# Patient Record
Sex: Female | Born: 1945 | Race: White | Hispanic: No | Marital: Married | Smoking: Never smoker
Health system: Southern US, Community
[De-identification: ages and names within clinical notes are randomized; demographics above are authoritative.]

## PROBLEM LIST (undated history)

## (undated) DIAGNOSIS — E785 Hyperlipidemia, unspecified: Secondary | ICD-10-CM

---

## 2012-08-09 ENCOUNTER — Ambulatory Visit: Payer: Self-pay | Admitting: Family Medicine

## 2012-11-30 ENCOUNTER — Ambulatory Visit: Payer: Self-pay | Admitting: Emergency Medicine

## 2012-12-03 LAB — BETA STREP CULTURE(ARMC)

## 2013-03-11 ENCOUNTER — Ambulatory Visit: Payer: Self-pay

## 2013-03-11 LAB — URINALYSIS, COMPLETE
Bilirubin,UR: NEGATIVE
Glucose,UR: NEGATIVE mg/dL (ref 0–75)
Ketone: NEGATIVE
Nitrite: NEGATIVE
Protein: NEGATIVE
Specific Gravity: 1.01 (ref 1.003–1.030)

## 2013-03-13 LAB — URINE CULTURE

## 2013-08-12 ENCOUNTER — Ambulatory Visit: Payer: Self-pay | Admitting: Family Medicine

## 2014-09-02 ENCOUNTER — Other Ambulatory Visit: Payer: Self-pay | Admitting: Family Medicine

## 2014-09-02 ENCOUNTER — Ambulatory Visit
Admission: RE | Admit: 2014-09-02 | Discharge: 2014-09-02 | Disposition: A | Discharge status: Home / Self Care | Payer: Medicare PPO | Source: Ambulatory Visit | Attending: Family Medicine | Admitting: Family Medicine

## 2014-09-02 DIAGNOSIS — Z1239 Encounter for other screening for malignant neoplasm of breast: Secondary | ICD-10-CM

## 2014-09-04 ENCOUNTER — Other Ambulatory Visit: Payer: Self-pay | Admitting: Family Medicine

## 2014-09-04 DIAGNOSIS — R928 Other abnormal and inconclusive findings on diagnostic imaging of breast: Secondary | ICD-10-CM

## 2014-09-04 DIAGNOSIS — N63 Unspecified lump in unspecified breast: Secondary | ICD-10-CM

## 2014-09-11 ENCOUNTER — Ambulatory Visit
Admission: RE | Admit: 2014-09-11 | Discharge: 2014-09-11 | Disposition: A | Discharge status: Home / Self Care | Payer: Medicare PPO | Source: Ambulatory Visit | Attending: Family Medicine | Admitting: Family Medicine

## 2014-09-11 DIAGNOSIS — N63 Unspecified lump in unspecified breast: Secondary | ICD-10-CM

## 2014-09-11 DIAGNOSIS — R928 Other abnormal and inconclusive findings on diagnostic imaging of breast: Secondary | ICD-10-CM

## 2014-09-21 ENCOUNTER — Other Ambulatory Visit: Payer: Self-pay | Admitting: Family Medicine

## 2014-09-21 DIAGNOSIS — R928 Other abnormal and inconclusive findings on diagnostic imaging of breast: Secondary | ICD-10-CM

## 2014-09-21 DIAGNOSIS — N63 Unspecified lump in unspecified breast: Secondary | ICD-10-CM

## 2014-09-29 ENCOUNTER — Other Ambulatory Visit: Payer: Self-pay | Admitting: Family Medicine

## 2014-09-29 ENCOUNTER — Ambulatory Visit
Admission: RE | Admit: 2014-09-29 | Discharge: 2014-09-29 | Disposition: A | Discharge status: Home / Self Care | Payer: Medicare PPO | Source: Ambulatory Visit | Attending: Family Medicine | Admitting: Family Medicine

## 2014-09-29 DIAGNOSIS — N63 Unspecified lump in unspecified breast: Secondary | ICD-10-CM

## 2014-09-29 DIAGNOSIS — R928 Other abnormal and inconclusive findings on diagnostic imaging of breast: Secondary | ICD-10-CM

## 2014-09-29 DIAGNOSIS — N6002 Solitary cyst of left breast: Secondary | ICD-10-CM | POA: Diagnosis not present

## 2015-08-23 ENCOUNTER — Other Ambulatory Visit: Payer: Self-pay | Admitting: Family Medicine

## 2015-08-23 DIAGNOSIS — Z1231 Encounter for screening mammogram for malignant neoplasm of breast: Secondary | ICD-10-CM

## 2015-09-06 ENCOUNTER — Ambulatory Visit: Admission: RE | Admit: 2015-09-06 | Payer: Medicare PPO | Source: Ambulatory Visit

## 2015-09-20 ENCOUNTER — Ambulatory Visit: Payer: Medicare PPO

## 2015-09-27 ENCOUNTER — Ambulatory Visit: Payer: Medicare PPO

## 2015-10-11 ENCOUNTER — Ambulatory Visit
Admission: RE | Admit: 2015-10-11 | Discharge: 2015-10-11 | Disposition: A | Discharge status: Home / Self Care | Payer: Medicare HMO | Source: Ambulatory Visit | Attending: Family Medicine | Admitting: Family Medicine

## 2015-10-11 DIAGNOSIS — Z1231 Encounter for screening mammogram for malignant neoplasm of breast: Secondary | ICD-10-CM | POA: Diagnosis present

## 2015-10-17 IMAGING — MG MM DIGITAL DIAGNOSTIC UNILAT*L*
2 series · 2 of 2 positions shown · non-contrast
Comparison: Prior exams

CLINICAL DATA: Patient presented for ultrasound-guided core needle
biopsy of an area of abnormal echogenicity in the left breast
corresponding to a small focal opacity in the lateral left breast
mammographically.

EXAM:
DIGITAL DIAGNOSTIC LEFT MAMMOGRAM WITH CAD
ULTRASOUND LEFT BREAST

[L CC]
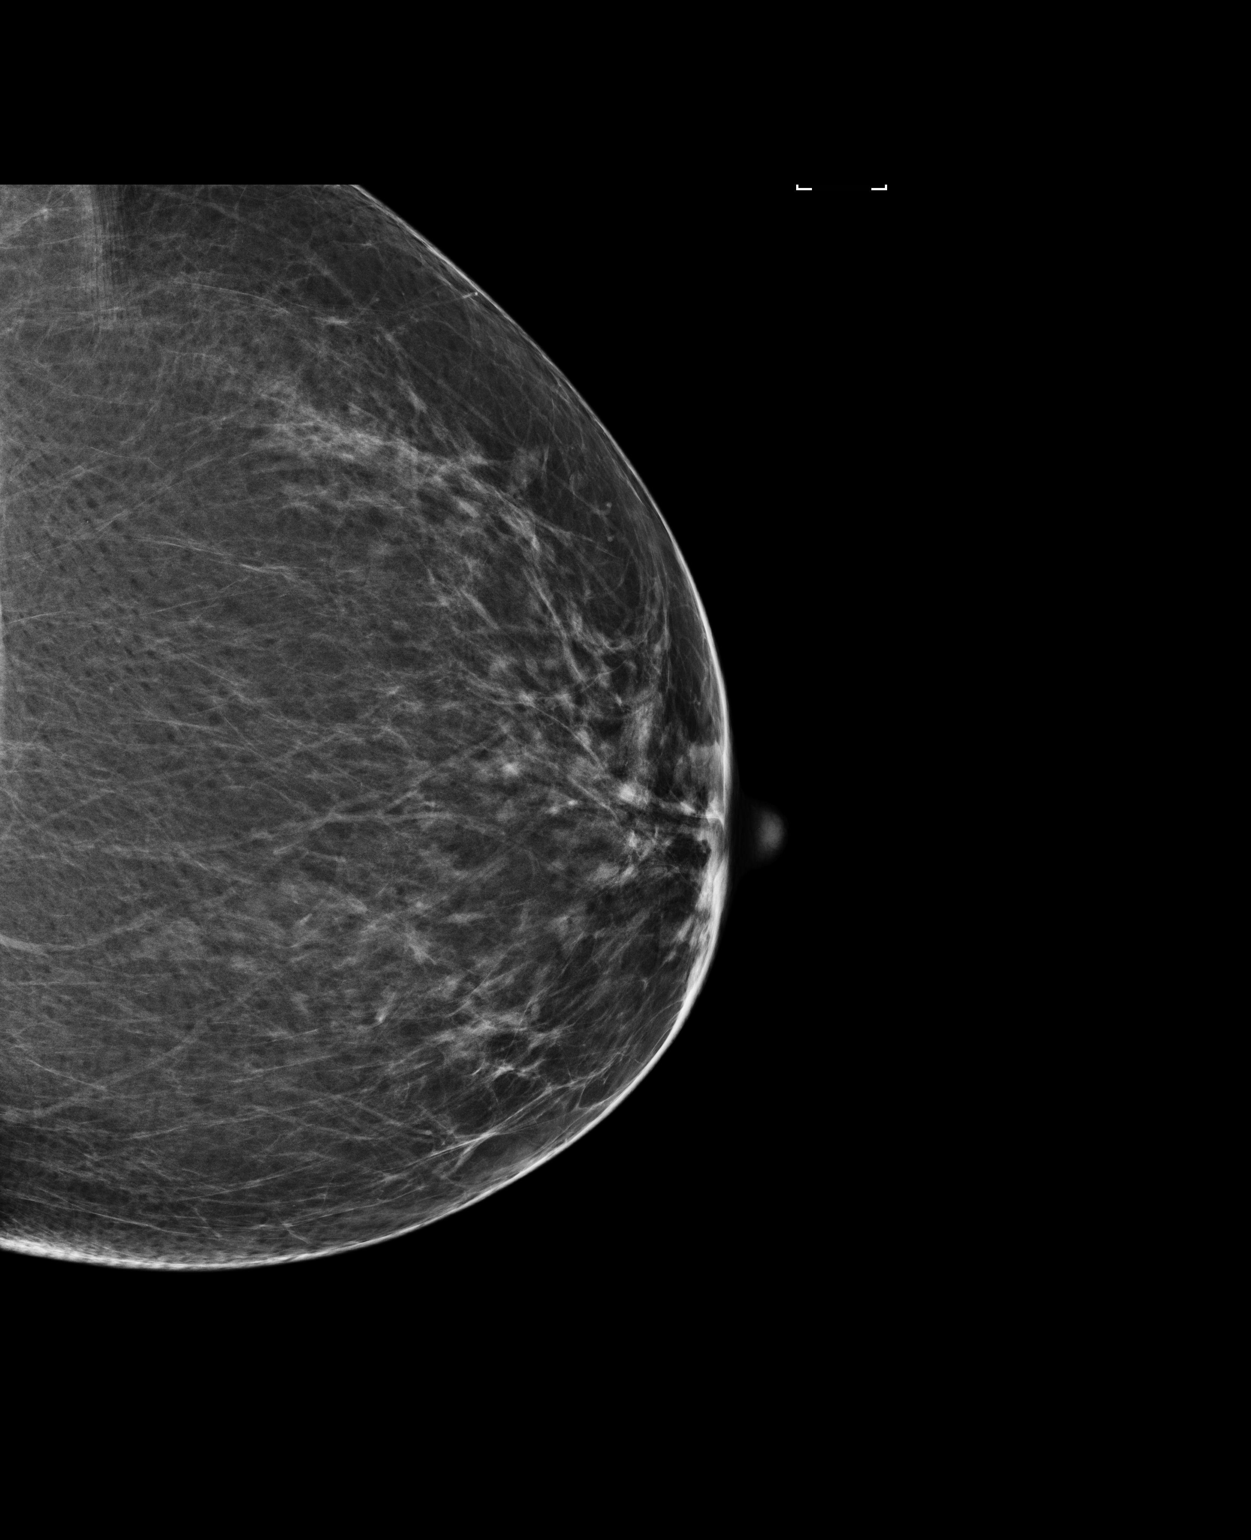

[L MLO]
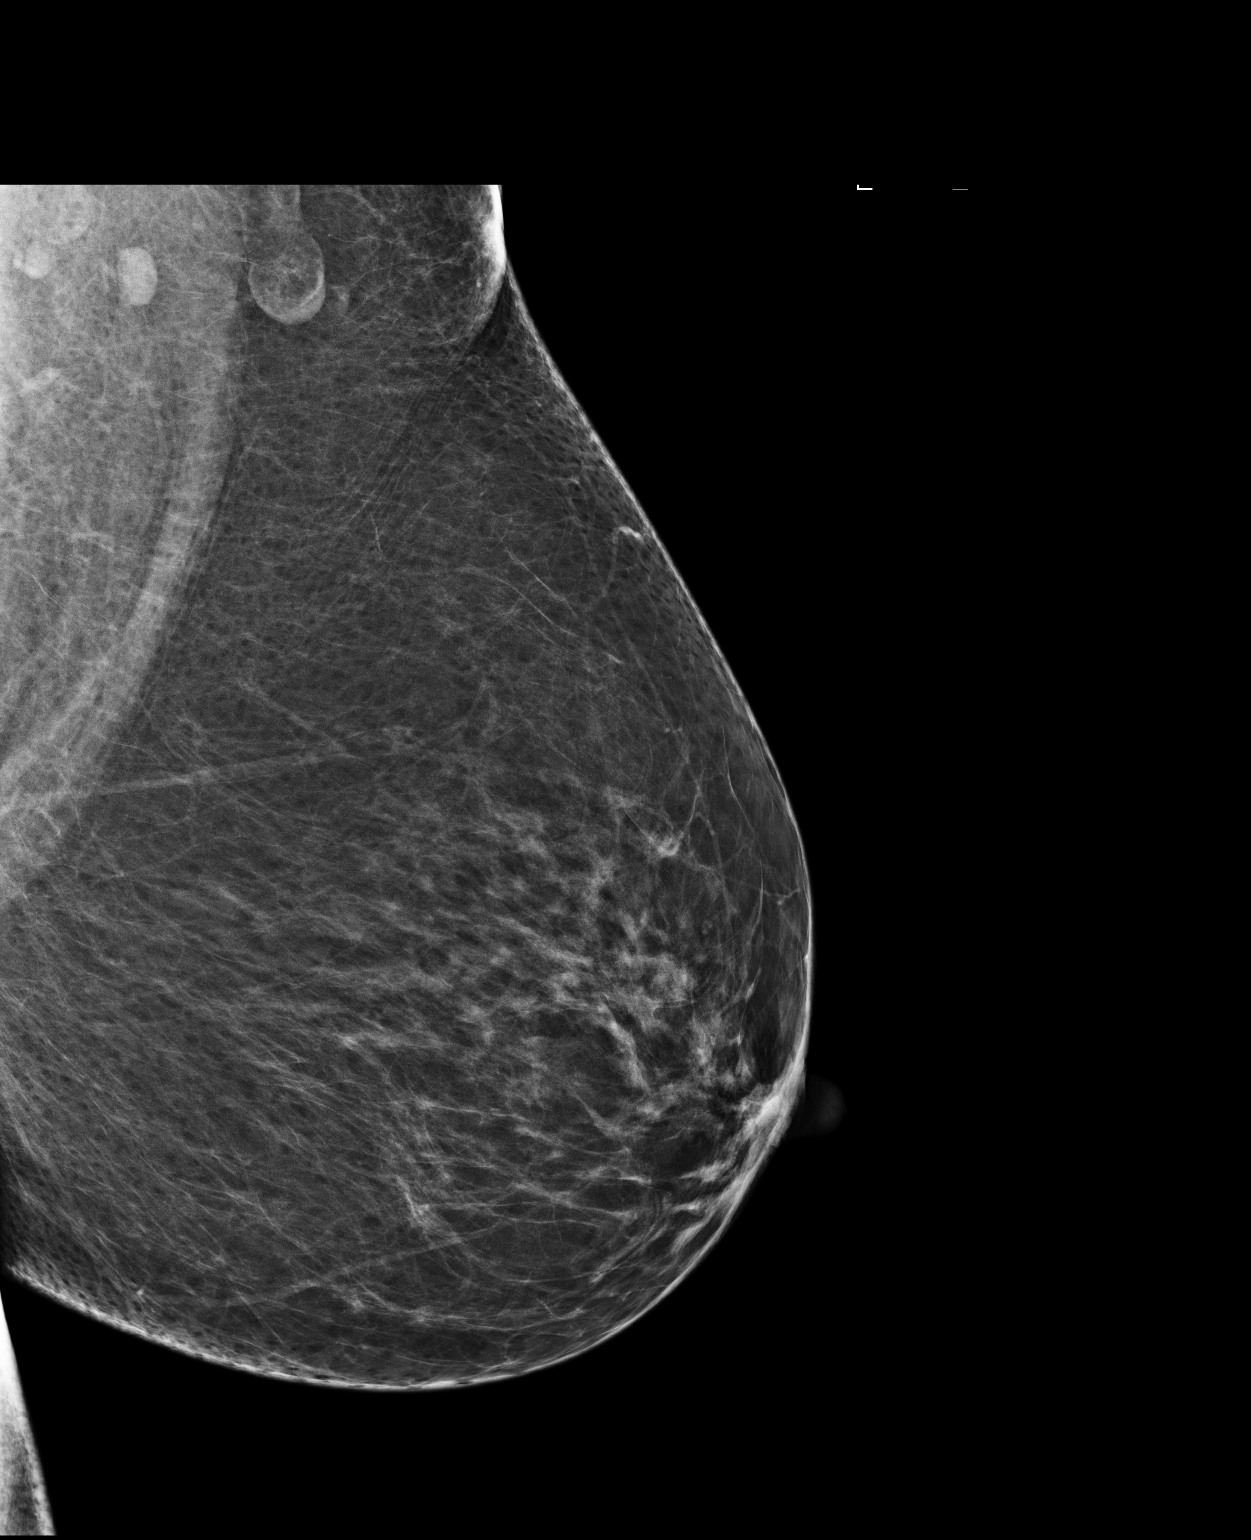

[2 of 2 positions shown; findings below may reference images not displayed]

ACR Breast Density Category b: There are scattered areas of
fibroglandular density.
FINDINGS: The focal opacity noted on the prior diagnostic and current
screening mammogram is no longer present. There are no discrete
masses or new abnormalities. No suspicious calcifications.

Mammographic images were processed with CAD.

Targeted ultrasound is performed, showing small cyst in the left
breast at 2 o'clock, 4 cm the nipple. The abnormality seen on the
prior ultrasound, which was in the 2:30 o'clock position, 7 cm the
nipple, is no longer visualized.
IMPRESSION: Lesion seen on the previous mammogram and ultrasound is no longer
appreciated. This is consistent with a resolved contusion. No
evidence of malignancy.

RECOMMENDATION:
Screening mammogram in one year.(Code:PE-W-ZCS)

I have discussed the findings and recommendations with the patient.
Results were also provided in writing at the conclusion of the
visit. If applicable, a reminder letter will be sent to the patient
regarding the next appointment.

BI-RADS CATEGORY  2: Benign.

## 2016-10-12 ENCOUNTER — Other Ambulatory Visit: Payer: Self-pay | Admitting: Family Medicine

## 2016-10-12 DIAGNOSIS — Z1231 Encounter for screening mammogram for malignant neoplasm of breast: Secondary | ICD-10-CM

## 2016-11-06 ENCOUNTER — Ambulatory Visit
Admission: RE | Admit: 2016-11-06 | Discharge: 2016-11-06 | Disposition: A | Discharge status: Home / Self Care | Payer: Medicare HMO | Source: Ambulatory Visit | Attending: Family Medicine | Admitting: Family Medicine

## 2016-11-06 DIAGNOSIS — Z1231 Encounter for screening mammogram for malignant neoplasm of breast: Secondary | ICD-10-CM | POA: Diagnosis present

## 2017-05-30 ENCOUNTER — Other Ambulatory Visit: Payer: Self-pay | Admitting: Family Medicine

## 2017-05-30 DIAGNOSIS — Z1239 Encounter for other screening for malignant neoplasm of breast: Secondary | ICD-10-CM

## 2017-06-18 ENCOUNTER — Other Ambulatory Visit: Payer: Self-pay

## 2017-06-18 ENCOUNTER — Encounter: Payer: Self-pay | Admitting: Emergency Medicine

## 2017-06-18 ENCOUNTER — Ambulatory Visit
Admission: EM | Admit: 2017-06-18 | Discharge: 2017-06-18 | Disposition: A | Discharge status: Home / Self Care | Payer: Medicare HMO | Attending: Family Medicine | Admitting: Family Medicine

## 2017-06-18 DIAGNOSIS — J0100 Acute maxillary sinusitis, unspecified: Secondary | ICD-10-CM | POA: Diagnosis not present

## 2017-06-18 HISTORY — DX: Hyperlipidemia, unspecified: E78.5

## 2017-06-18 MED ORDER — AMOXICILLIN 875 MG PO TABS: 875.0000 mg | ORAL_TABLET | Freq: Two times a day (BID) | ORAL | 0 refills | Status: AC

## 2017-06-18 NOTE — ED Provider Notes (Signed)
[facility]    CSN: [id number] Arrival date & time: 06/18/17  1817     History   Chief Complaint Chief Complaint  Patient presents with  . Cough    HPI Terri Golden is a 72 y.o. female.   The history is provided by the patient.  Cough  Associated symptoms: headaches   URI  Presenting symptoms: congestion and cough   Severity:  Moderate Onset quality:  Sudden Duration:  1 month Timing:  Constant Progression:  Worsening Chronicity:  New Relieved by:  Nothing Ineffective treatments:  OTC medications Associated symptoms: headaches and sinus pain   Risk factors: sick contacts   Risk factors: not elderly, no chronic cardiac disease, no chronic kidney disease, no chronic respiratory disease, no diabetes mellitus, no immunosuppression, no recent illness and no recent travel     Past Medical History:  Diagnosis Date  . Hyperlipemia     There are no active problems to display for this patient.   History reviewed. No pertinent surgical history.  OB History    No data available       Home Medications    Prior to Admission medications   Medication Sig Start Date End Date Taking? Authorizing Provider  hydrochlorothiazide (HYDRODIURIL) 12.5 MG tablet Take 12.5 mg by mouth daily.   Yes [provider]  loratadine (CLARITIN) 10 MG tablet Take 10 mg by mouth daily.   Yes [provider]  metFORMIN (GLUCOPHAGE) 500 MG tablet Take by mouth 2 (two) times daily with a meal.   Yes [provider]  simvastatin (ZOCOR) 10 MG tablet Take 10 mg by mouth daily.   Yes [provider]  amoxicillin (AMOXIL) 875 MG tablet Take 1 tablet (875 mg total) by mouth 2 (two) times daily. 06/18/17   Payton Mccallum, MD    Family History Family History  Problem Relation Age of Onset  . Breast cancer Cousin        mat cousin  . Cancer Mother   . Heart failure Father   . Cancer Brother     Social History Social History   Tobacco  Use  . Smoking status: Never Smoker  . Smokeless tobacco: Never Used  Substance Use Topics  . Alcohol use: Yes  . Drug use: No     Allergies   Patient has no known allergies.   Review of Systems Review of Systems  HENT: Positive for congestion and sinus pain.   Respiratory: Positive for cough.   Neurological: Positive for headaches.     Physical Exam Triage Vital Signs ED Triage Vitals  Enc Vitals Group     BP 06/18/17 1839 (!) 144/82     Pulse Rate 06/18/17 1839 (!) 111     Resp 06/18/17 1839 18     Temp 06/18/17 1839 98.5 F (36.9 C)     Temp src --      SpO2 06/18/17 1843 95 %     Weight 06/18/17 1832 178 lb (80.7 kg)     Height 06/18/17 1832 5\' 4"  (1.626 m)     Head Circumference --      Peak Flow --      Pain Score 06/18/17 1832 6     Pain Loc --      Pain Edu? --      Excl. in GC? --    No data found.  Updated Vital Signs BP (!) 144/82 (BP Location: Left Arm)   Pulse (!) 111   Temp  98.5 F (36.9 C)   Resp 18   Ht 5\' 4"  (1.626 m)   Wt 178 lb (80.7 kg)   SpO2 95%   BMI 30.55 kg/m   Visual Acuity Right Eye Distance:   Left Eye Distance:   Bilateral Distance:    Right Eye Near:   Left Eye Near:    Bilateral Near:     Physical Exam  Constitutional: She appears well-developed and well-nourished. No distress.  HENT:  Head: Normocephalic and atraumatic.  Right Ear: Tympanic membrane, external ear and ear canal normal.  Left Ear: Tympanic membrane, external ear and ear canal normal.  Nose: Mucosal edema and rhinorrhea present. No nose lacerations, sinus tenderness, nasal deformity, septal deviation or nasal septal hematoma. No epistaxis.  No foreign bodies. Right sinus exhibits maxillary sinus tenderness and frontal sinus tenderness. Left sinus exhibits maxillary sinus tenderness and frontal sinus tenderness.  Mouth/Throat: Uvula is midline, oropharynx is clear and moist and mucous membranes are normal. No oropharyngeal exudate.  Eyes: Conjunctivae  and EOM are normal. Pupils are equal, round, and reactive to light. Right eye exhibits no discharge. Left eye exhibits no discharge. No scleral icterus.  Neck: Normal range of motion. Neck supple. No thyromegaly present.  Cardiovascular: Normal rate, regular rhythm and normal heart sounds.  Pulmonary/Chest: Effort normal and breath sounds normal. No respiratory distress. She has no wheezes. She has no rales.  Lymphadenopathy:    She has no cervical adenopathy.  Skin: She is not diaphoretic.  Nursing note and vitals reviewed.    UC Treatments / Results  Labs (all labs ordered are listed, but only abnormal results are displayed) Labs Reviewed - No data to display  EKG  EKG Interpretation None       Radiology No results found.  Procedures Procedures (including critical care time)  Medications Ordered in UC Medications - No data to display   Initial Impression / Assessment and Plan / UC Course  I have reviewed the triage vital signs and the nursing notes.  Pertinent labs & imaging results that were available during my care of the patient were reviewed by me and considered in my medical decision making (see chart for details).        Final Clinical Impressions(s) / UC Diagnoses   Final diagnoses:  Acute maxillary sinusitis, recurrence not specified    ED Discharge Orders        Ordered    amoxicillin (AMOXIL) 875 MG tablet  2 times daily     06/18/17 1900     1. diagnosis reviewed with patient 2. rx as per orders above; reviewed possible side effects, interactions, risks and benefits  3. Recommend supportive treatment with otc flonase 4. Follow-up prn if symptoms worsen or don't improve  Controlled Substance Prescriptions [place] Controlled Substance Registry consulted? Not Applicable   Payton Mccallumonty, Neale Marzette, MD 06/18/17 2003

## 2017-06-18 NOTE — ED Triage Notes (Signed)
Patient states she has had a cough for at least a month. Patient states she has a headache and swollen lymph glands

## 2017-11-12 ENCOUNTER — Encounter (INDEPENDENT_AMBULATORY_CARE_PROVIDER_SITE_OTHER): Payer: Self-pay

## 2017-11-12 ENCOUNTER — Other Ambulatory Visit: Payer: Self-pay | Admitting: Family Medicine

## 2017-11-12 ENCOUNTER — Ambulatory Visit
Admission: RE | Admit: 2017-11-12 | Discharge: 2017-11-12 | Disposition: A | Discharge status: Home / Self Care | Payer: Medicare HMO | Source: Ambulatory Visit | Attending: Family Medicine | Admitting: Family Medicine

## 2017-11-12 DIAGNOSIS — Z1231 Encounter for screening mammogram for malignant neoplasm of breast: Secondary | ICD-10-CM | POA: Diagnosis not present

## 2017-11-12 DIAGNOSIS — Z1239 Encounter for other screening for malignant neoplasm of breast: Secondary | ICD-10-CM

## 2018-11-12 ENCOUNTER — Other Ambulatory Visit: Payer: Self-pay | Admitting: Family Medicine

## 2018-11-12 DIAGNOSIS — Z1231 Encounter for screening mammogram for malignant neoplasm of breast: Secondary | ICD-10-CM

## 2018-12-16 ENCOUNTER — Ambulatory Visit
Admission: RE | Admit: 2018-12-16 | Discharge: 2018-12-16 | Disposition: A | Discharge status: Home / Self Care | Payer: Medicare HMO | Source: Ambulatory Visit | Attending: Family Medicine | Admitting: Family Medicine

## 2018-12-16 ENCOUNTER — Other Ambulatory Visit: Payer: Self-pay

## 2018-12-16 DIAGNOSIS — Z1231 Encounter for screening mammogram for malignant neoplasm of breast: Secondary | ICD-10-CM

## 2019-10-27 ENCOUNTER — Other Ambulatory Visit: Payer: Self-pay | Admitting: Family Medicine

## 2019-10-27 DIAGNOSIS — Z780 Asymptomatic menopausal state: Secondary | ICD-10-CM

## 2019-11-03 ENCOUNTER — Other Ambulatory Visit: Payer: Self-pay | Admitting: Family Medicine

## 2019-11-03 DIAGNOSIS — Z1231 Encounter for screening mammogram for malignant neoplasm of breast: Secondary | ICD-10-CM

## 2019-12-18 ENCOUNTER — Inpatient Hospital Stay: Admission: RE | Admit: 2019-12-18 | Payer: Medicare HMO | Source: Ambulatory Visit

## 2019-12-18 ENCOUNTER — Ambulatory Visit: Payer: Medicare HMO

## 2020-01-05 ENCOUNTER — Ambulatory Visit
Admission: RE | Admit: 2020-01-05 | Discharge: 2020-01-05 | Disposition: A | Discharge status: Home / Self Care | Payer: Medicare HMO | Source: Ambulatory Visit | Attending: Family Medicine | Admitting: Family Medicine

## 2020-01-05 ENCOUNTER — Other Ambulatory Visit: Payer: Self-pay

## 2020-01-05 DIAGNOSIS — Z1231 Encounter for screening mammogram for malignant neoplasm of breast: Secondary | ICD-10-CM | POA: Diagnosis present

## 2020-01-05 DIAGNOSIS — Z780 Asymptomatic menopausal state: Secondary | ICD-10-CM | POA: Diagnosis not present

## 2020-12-03 ENCOUNTER — Other Ambulatory Visit: Payer: Self-pay | Admitting: Family Medicine

## 2020-12-03 DIAGNOSIS — Z1231 Encounter for screening mammogram for malignant neoplasm of breast: Secondary | ICD-10-CM

## 2021-01-10 ENCOUNTER — Ambulatory Visit
Admission: RE | Admit: 2021-01-10 | Discharge: 2021-01-10 | Disposition: A | Discharge status: Home / Self Care | Payer: Medicare HMO | Source: Ambulatory Visit | Attending: Family Medicine | Admitting: Family Medicine

## 2021-01-10 ENCOUNTER — Other Ambulatory Visit: Payer: Self-pay

## 2021-01-10 DIAGNOSIS — Z1231 Encounter for screening mammogram for malignant neoplasm of breast: Secondary | ICD-10-CM

## 2021-10-21 ENCOUNTER — Encounter: Admission: RE | Payer: Self-pay | Source: Home / Self Care

## 2021-10-21 ENCOUNTER — Ambulatory Visit: Admission: RE | Admit: 2021-10-21 | Payer: Medicare HMO | Source: Home / Self Care

## 2021-10-21 SURGERY — COLONOSCOPY WITH PROPOFOL
Anesthesia: General

## 2022-01-28 IMAGING — MG MM DIGITAL SCREENING BILAT W/ TOMO AND CAD
8 series · 8 of 24 positions shown · non-contrast
Comparison: Previous exam(s).

CLINICAL DATA: Screening.

EXAM:
DIGITAL SCREENING BILATERAL MAMMOGRAM WITH TOMOSYNTHESIS AND CAD
TECHNIQUE: Bilateral screening digital craniocaudal and mediolateral oblique
mammograms were obtained. Bilateral screening digital breast
tomosynthesis was performed. The images were evaluated with
computer-aided detection.

[L CC synth-2D]
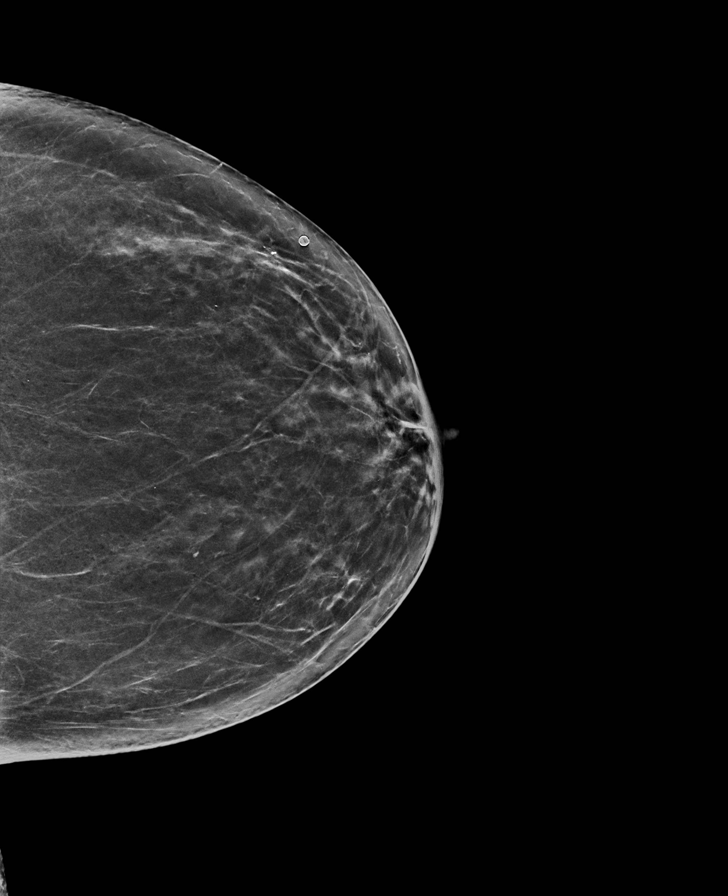

[R MLO synth-2D]
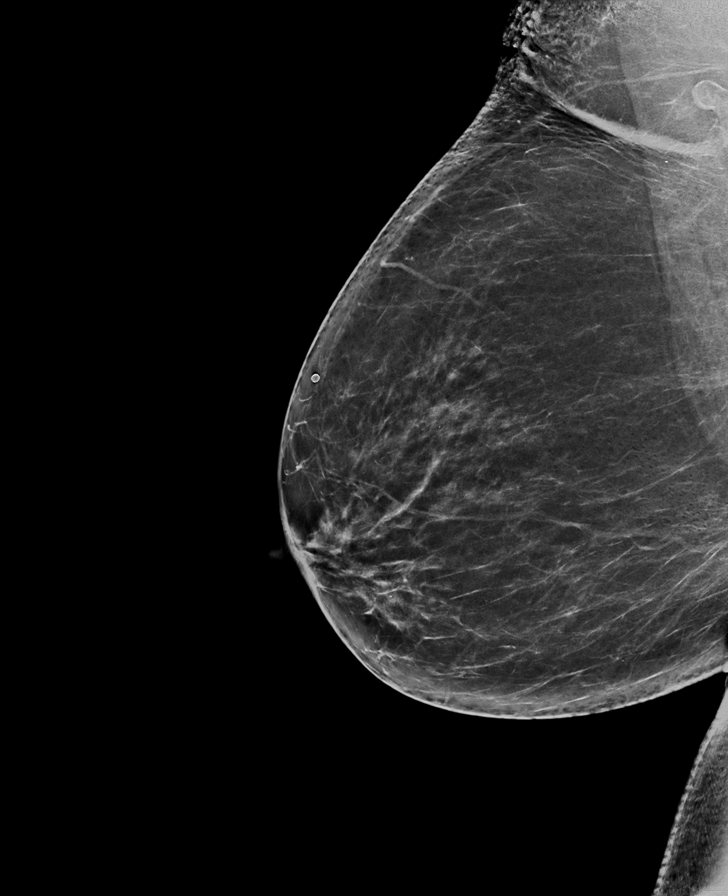

[R CC synth-2D]
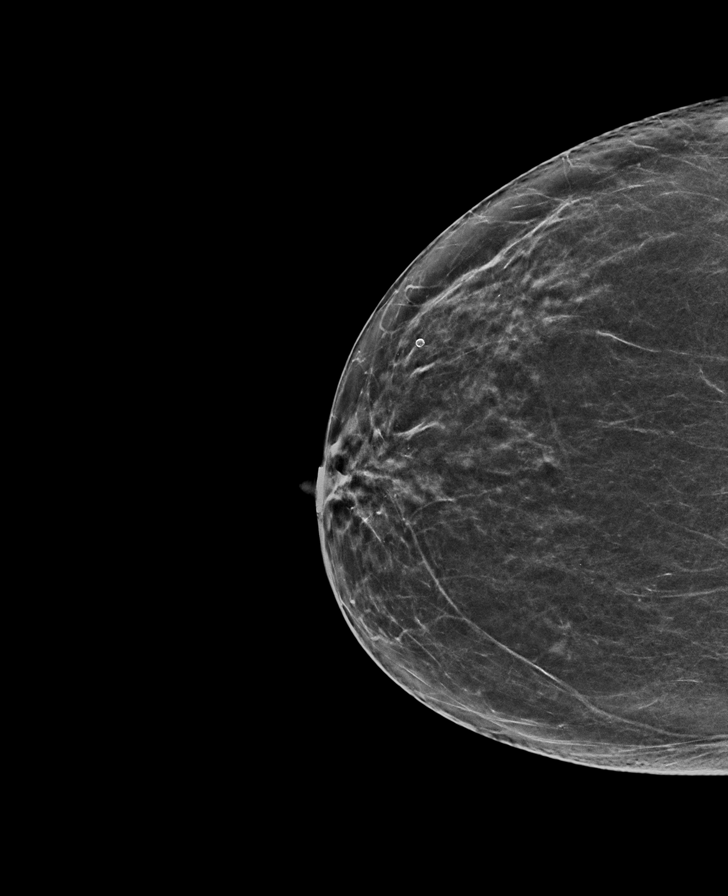

[L MLO synth-2D]
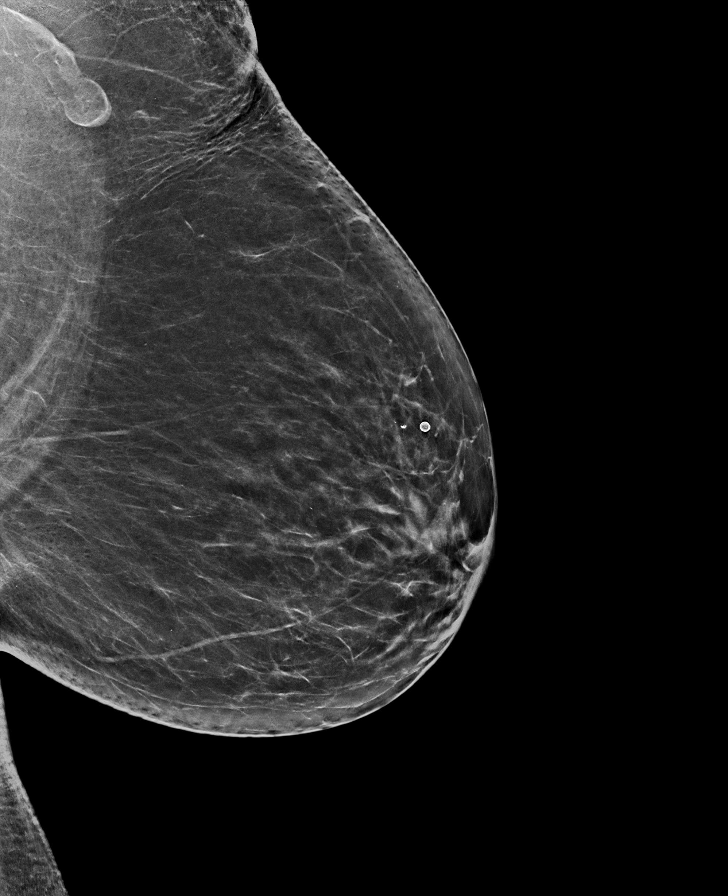

[R CC tomo · tomo slice 32/63.0]
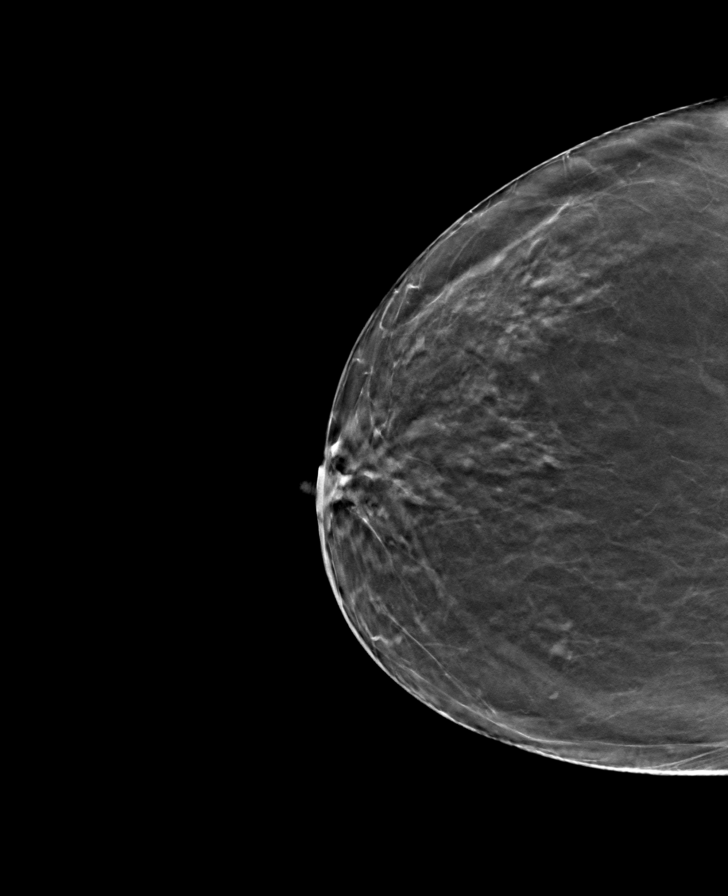

[R MLO tomo · tomo slice 38/75.0]
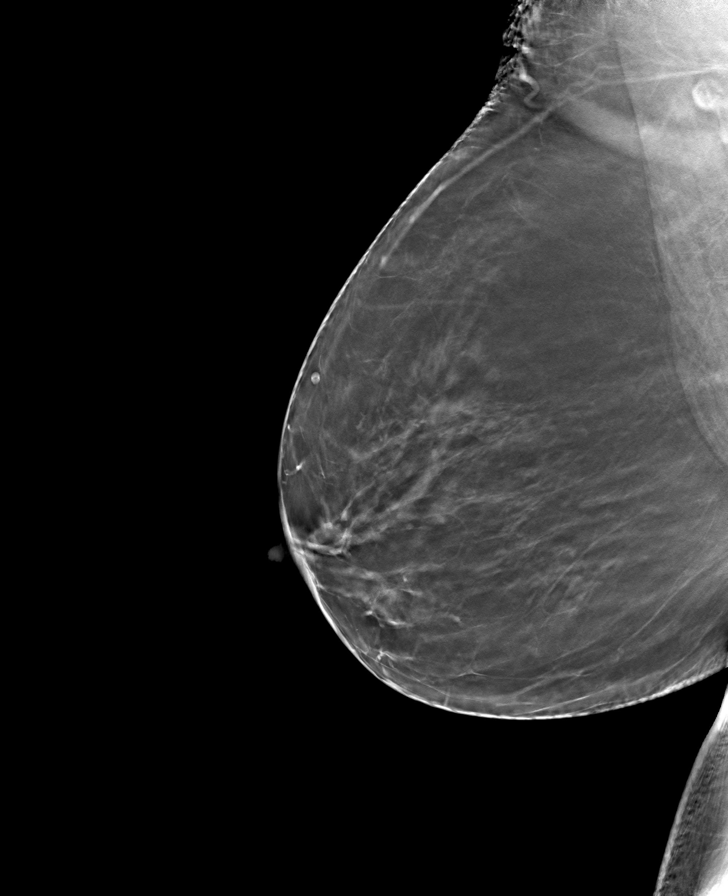

[L MLO tomo · tomo slice 37/73.0]
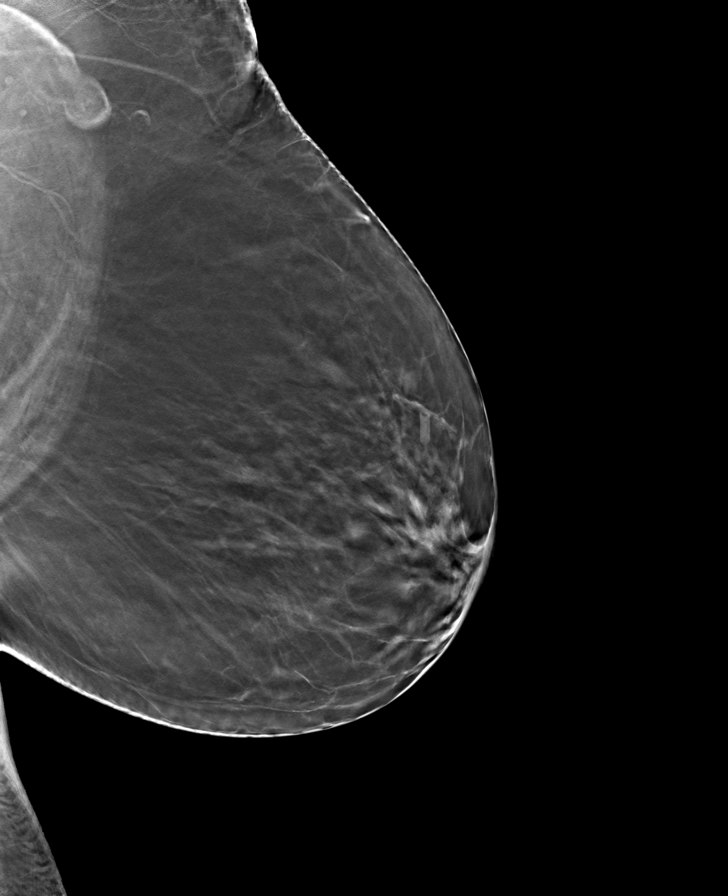

[L CC tomo · tomo slice 35/68.0]
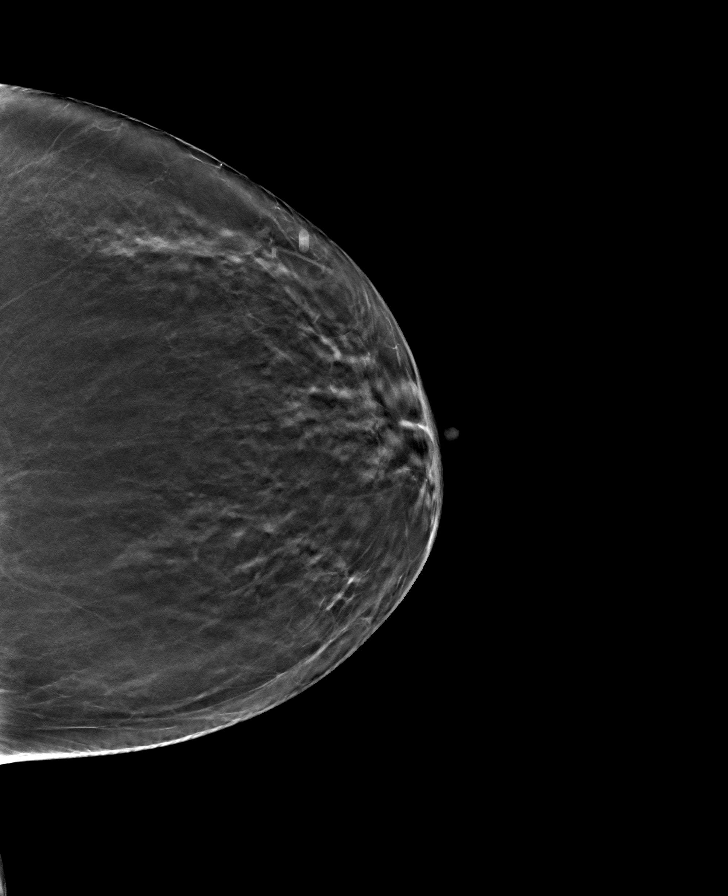

[8 of 24 positions shown; findings below may reference images not displayed]

ACR Breast Density Category b: There are scattered areas of
fibroglandular density.
FINDINGS: There are no findings suspicious for malignancy.
IMPRESSION: No mammographic evidence of malignancy. A result letter of this
screening mammogram will be mailed directly to the patient.

RECOMMENDATION:
Screening mammogram in one year. (Code:51-[id number])

BI-RADS CATEGORY  1: Negative.

## 2022-10-20 ENCOUNTER — Encounter: Payer: Self-pay | Admitting: Emergency Medicine

## 2022-10-20 ENCOUNTER — Ambulatory Visit
Admission: EM | Admit: 2022-10-20 | Discharge: 2022-10-20 | Disposition: A | Discharge status: Home / Self Care | Payer: Medicare HMO

## 2022-10-20 DIAGNOSIS — U071 COVID-19: Secondary | ICD-10-CM | POA: Diagnosis not present

## 2022-10-20 MED ORDER — PAXLOVID (300/100) 20 X 150 MG & 10 X 100MG PO TBPK: 3.0000 | ORAL_TABLET | Freq: Two times a day (BID) | ORAL | 0 refills | Status: AC

## 2022-10-20 NOTE — ED Triage Notes (Signed)
Pt presents with a cough, runny nose, sore throat and bilateral ear pain x 2 days. At home Covid test today was positive

## 2022-10-20 NOTE — Discharge Instructions (Addendum)
COVID-19 is a virus and will improve with time  You will need to quarantine until you are 24 hours without a fever, if no fever, you may return to activity wearing a mask until your symptoms resolve  Starting tomorrow Begin Paxlovid which is the antiviral, this helps reduce the severity of your symptoms and ideally the timeline that you are sick, does not fully take away her illness and therefore you still may experience some symptoms  Stop your simvastatin while taking Paxlovid, may resume in 10 days, restart medicine on Tuesday, October 31, 2022    You can take Tylenol and/or Ibuprofen as needed for fever reduction and pain relief.   For cough: honey 1/2 to 1 teaspoon (you can dilute the honey in water or another fluid).  You can also use guaifenesin and dextromethorphan for cough. You can use a humidifier for chest congestion and cough.  If you don't have a humidifier, you can sit in the bathroom with the hot shower running.      For sore throat: try warm salt water gargles, cepacol lozenges, throat spray, warm tea or water with lemon/honey, popsicles or ice, or OTC cold relief medicine for throat discomfort.   For congestion: take a daily anti-histamine like Zyrtec, Claritin, and a oral decongestant, such as pseudoephedrine.  You can also use Flonase 1-2 sprays in each nostril daily.   It is important to stay hydrated: drink plenty of fluids (water, gatorade/powerade/pedialyte, juices, or teas) to keep your throat moisturized and help further relieve irritation/discomfort.

## 2022-10-20 NOTE — ED Provider Notes (Signed)
[facility]    CSN: [id number] Arrival date & time: 10/20/22  1821      History   Chief Complaint Chief Complaint  Patient presents with   Cough   Nasal Congestion   Otalgia   Sore Throat    HPI Terri Golden is a 77 y.o. female.    Patient presents for evaluation of nasal congestion, rhinorrhea, sore throat, nonproductive cough and bilateral ear pain present for 2 days.  Associated general malaise.  Has attempted use of antihistamines as she initially thought symptoms were related to allergies.  Home COVID test positive this afternoon.  No known sick contacts prior.  Tolerating food and liquids.  Denies presence of fevers.     Past Medical History:  Diagnosis Date   Hyperlipemia     There are no problems to display for this patient.   History reviewed. No pertinent surgical history.  OB History   No obstetric history on file.      Home Medications    Prior to Admission medications   Medication Sig Start Date End Date Taking? Authorizing Provider  atorvastatin (LIPITOR) 80 MG tablet Take 1 tablet by mouth daily. 03/08/22  Yes [provider]  cetirizine (ZYRTEC) 10 MG tablet Take 1 tablet by mouth daily. 01/20/22  Yes [provider]  enalapril (VASOTEC) 2.5 MG tablet Take 1 tablet by mouth at bedtime. 08/24/22  Yes [provider]  Omega-3 1000 MG CAPS Take 1 tablet by mouth 2 (two) times daily. 04/28/10  Yes [provider]  omeprazole (PRILOSEC) 20 MG capsule Take 1 capsule by mouth daily as needed. 08/24/22  Yes [provider]  sertraline (ZOLOFT) 100 MG tablet Take 1 tablet by mouth daily. 07/24/22  Yes [provider]  Acetaminophen 500 MG capsule Take by mouth.    [provider]  amoxicillin (AMOXIL) 875 MG tablet Take 1 tablet (875 mg total) by mouth 2 (two) times daily. 06/18/17   Payton Mccallum, MD  hydrochlorothiazide (HYDRODIURIL) 12.5 MG tablet Take 12.5 mg by mouth daily.     [provider]  loratadine (CLARITIN) 10 MG tablet Take 10 mg by mouth daily.    [provider]  meloxicam (MOBIC) 7.5 MG tablet Take 7.5 mg by mouth daily as needed.    [provider]  metFORMIN (GLUCOPHAGE) 500 MG tablet Take by mouth 2 (two) times daily with a meal.    [provider]  simvastatin (ZOCOR) 10 MG tablet Take 10 mg by mouth daily.    [provider]    Family History Family History  Problem Relation Age of Onset   Breast cancer Cousin        mat cousin   Cancer Mother    Heart failure Father    Cancer Brother     Social History Social History   Tobacco Use   Smoking status: Never   Smokeless tobacco: Never  Vaping Use   Vaping status: Never Used  Substance Use Topics   Alcohol use: Yes   Drug use: No     Allergies   Citalopram   Review of Systems Review of Systems  HENT:  Positive for congestion, ear pain, rhinorrhea and sore throat. Negative for dental problem, drooling, ear discharge, facial swelling, hearing loss, mouth sores, nosebleeds, postnasal drip, sinus pressure, sinus pain, sneezing, tinnitus, trouble swallowing and voice change.   Respiratory:  Positive for cough. Negative for apnea, choking, chest tightness, shortness of breath, wheezing and stridor.  Cardiovascular: Negative.   Gastrointestinal: Negative.   Musculoskeletal:  Positive for myalgias. Negative for arthralgias, back pain, gait problem, joint swelling, neck pain and neck stiffness.     Physical Exam Triage Vital Signs ED Triage Vitals  Encounter Vitals Group     BP 10/20/22 1847 138/70     Systolic BP Percentile --      Diastolic BP Percentile --      Pulse Rate 10/20/22 1847 97     Resp 10/20/22 1847 16     Temp 10/20/22 1847 98.6 F (37 C)     Temp Source 10/20/22 1847 Oral     SpO2 10/20/22 1847 95 %     Weight --      Height --      Head Circumference --      Peak Flow --      Pain Score 10/20/22 1845 0      Pain Loc --      Pain Education --      Exclude from Growth Chart --    No data found.  Updated Vital Signs BP 138/70 (BP Location: Right Arm)   Pulse 97   Temp 98.6 F (37 C) (Oral)   Resp 16   SpO2 95%   Visual Acuity Right Eye Distance:   Left Eye Distance:   Bilateral Distance:    Right Eye Near:   Left Eye Near:    Bilateral Near:     Physical Exam Constitutional:      Appearance: Normal appearance.  HENT:     Head: Normocephalic.     Right Ear: Tympanic membrane, ear canal and external ear normal.     Left Ear: Tympanic membrane, ear canal and external ear normal.     Nose: Congestion and rhinorrhea present.     Mouth/Throat:     Mouth: Mucous membranes are moist.     Pharynx: No posterior oropharyngeal erythema.  Eyes:     Extraocular Movements: Extraocular movements intact.  Cardiovascular:     Rate and Rhythm: Normal rate and regular rhythm.     Pulses: Normal pulses.     Heart sounds: Normal heart sounds.  Pulmonary:     Effort: Pulmonary effort is normal.     Breath sounds: Normal breath sounds.  Skin:    General: Skin is warm and dry.  Neurological:     General: No focal deficit present.     Mental Status: She is alert and oriented to person, place, and time. Mental status is at baseline.      UC Treatments / Results  Labs (all labs ordered are listed, but only abnormal results are displayed) Labs Reviewed - No data to display  EKG   Radiology No results found.  Procedures Procedures (including critical care time)  Medications Ordered in UC Medications - No data to display  Initial Impression / Assessment and Plan / UC Course  I have reviewed the triage vital signs and the nursing notes.  Pertinent labs & imaging results that were available during my care of the patient were reviewed by me and considered in my medical decision making (see chart for details).  COVID-19  Patient is in no signs of distress nor toxic appearing.   Vital signs are stable.  Low suspicion for pneumonia, pneumothorax or bronchitis and therefore will defer imaging.  Home COVID test positive.  Discussed quarantine per CDC guidelines.  Prescribed Paxlovid, discussed administration.  Advise discontinuation of simvastatin for 10 days during treatment.May  use additional over-the-counter medications as needed for supportive care.  May follow-up with urgent care as needed if symptoms persist or worsen.   Final Clinical Impressions(s) / UC Diagnoses   Final diagnoses:  COVID-19     Discharge Instructions      COVID-19 is a virus and will improve with time  You will need to quarantine until you are 24 hours without a fever, then you may return to activity wearing a mask until your symptoms resolve  Begin Paxlovid which is the antiviral, this helps reduce the severity of your symptoms and ideally the timeline that you are sick, does not fully take away her illness and therefore you still may experience some symptoms  Stop your simvastatin while taking Paxlovid, may resume in 10 days, restart medicine on Tuesday, October 31, 2022    You can take Tylenol and/or Ibuprofen as needed for fever reduction and pain relief.   For cough: honey 1/2 to 1 teaspoon (you can dilute the honey in water or another fluid).  You can also use guaifenesin and dextromethorphan for cough. You can use a humidifier for chest congestion and cough.  If you don't have a humidifier, you can sit in the bathroom with the hot shower running.      For sore throat: try warm salt water gargles, cepacol lozenges, throat spray, warm tea or water with lemon/honey, popsicles or ice, or OTC cold relief medicine for throat discomfort.   For congestion: take a daily anti-histamine like Zyrtec, Claritin, and a oral decongestant, such as pseudoephedrine.  You can also use Flonase 1-2 sprays in each nostril daily.   It is important to stay hydrated: drink plenty of fluids (water,  gatorade/powerade/pedialyte, juices, or teas) to keep your throat moisturized and help further relieve irritation/discomfort.    ED Prescriptions   None    PDMP not reviewed this encounter.   Valinda Hoar, NP 10/20/22 1916
